# Patient Record
Sex: Male | Born: 1975 | Race: White | Hispanic: Yes | Marital: Single | Smoking: Current every day smoker
Health system: Southern US, Community
[De-identification: ages and names within clinical notes are randomized; demographics above are authoritative.]

---

## 2010-04-13 ENCOUNTER — Emergency Department (HOSPITAL_COMMUNITY)
Admission: EM | Admit: 2010-04-13 | Discharge: 2010-04-14 | Payer: Self-pay | Source: Home / Self Care | Admitting: Emergency Medicine

## 2010-04-13 LAB — URINALYSIS, ROUTINE W REFLEX MICROSCOPIC
Bilirubin Urine: NEGATIVE
Hgb urine dipstick: NEGATIVE
Protein, ur: NEGATIVE mg/dL
Specific Gravity, Urine: 1.028 (ref 1.005–1.030)
Urine Glucose, Fasting: NEGATIVE mg/dL
Urobilinogen, UA: 0.2 mg/dL (ref 0.0–1.0)

## 2010-04-13 LAB — BASIC METABOLIC PANEL
CO2: 24 mEq/L (ref 19–32)
Calcium: 9.3 mg/dL (ref 8.4–10.5)
Creatinine, Ser: 0.78 mg/dL (ref 0.4–1.5)
GFR calc Af Amer: >60 mL/min (ref >60)
Glucose, Bld: 212 mg/dL — ABNORMAL HIGH (ref 70–99)
Potassium: 3.6 mEq/L (ref 3.5–5.1)
Sodium: 134 mEq/L — ABNORMAL LOW (ref 135–145)

## 2010-04-13 LAB — POCT CARDIAC MARKERS
CKMB, poc: 1.5 ng/mL (ref 1.0–8.0)
Troponin i, poc: <0.05 ng/mL (ref 0.00–0.09)

## 2010-04-13 LAB — DIFFERENTIAL
Lymphocytes Relative: 37 % (ref 12–46)
Lymphs Abs: 3.3 10*3/uL (ref 0.7–4.0)
Monocytes Relative: 7 % (ref 3–12)
Neutrophils Relative %: 54 % (ref 43–77)

## 2010-04-13 LAB — CBC
HCT: 41.6 % (ref 39.0–52.0)
Hemoglobin: 14.2 g/dL (ref 13.0–17.0)
MCH: 29.1 pg (ref 26.0–34.0)
MCV: 85.2 fL (ref 78.0–100.0)
Platelets: 188 10*3/uL (ref 150–400)
RBC: 4.88 MIL/uL (ref 4.22–5.81)
WBC: 9 10*3/uL (ref 4.0–10.5)

## 2010-04-16 ENCOUNTER — Emergency Department (HOSPITAL_COMMUNITY)
Admission: EM | Admit: 2010-04-16 | Discharge: 2010-04-17 | Payer: Self-pay | Source: Home / Self Care | Admitting: Emergency Medicine

## 2010-04-16 LAB — BASIC METABOLIC PANEL
BUN: 9 mg/dL (ref 6–23)
CO2: 24 mEq/L (ref 19–32)
Chloride: 106 mEq/L (ref 96–112)
Glucose, Bld: 180 mg/dL — ABNORMAL HIGH (ref 70–99)
Potassium: 3.5 mEq/L (ref 3.5–5.1)
Sodium: 139 mEq/L (ref 135–145)

## 2010-04-16 LAB — CBC
HCT: 36.4 % — ABNORMAL LOW (ref 39.0–52.0)
Hemoglobin: 13.2 g/dL (ref 13.0–17.0)
MCV: 83.5 fL (ref 78.0–100.0)
RBC: 4.36 MIL/uL (ref 4.22–5.81)
RDW: 13.3 % (ref 11.5–15.5)
WBC: 11.5 10*3/uL — ABNORMAL HIGH (ref 4.0–10.5)

## 2010-04-16 LAB — URINALYSIS, ROUTINE W REFLEX MICROSCOPIC
Ketones, ur: NEGATIVE mg/dL
Specific Gravity, Urine: 1.02 (ref 1.005–1.030)
Urine Glucose, Fasting: NEGATIVE mg/dL
pH: 5.5 (ref 5.0–8.0)

## 2010-04-16 LAB — DIFFERENTIAL
Basophils Absolute: 0 10*3/uL (ref 0.0–0.1)
Eosinophils Relative: 1 % (ref 0–5)
Lymphocytes Relative: 23 % (ref 12–46)
Lymphs Abs: 2.6 10*3/uL (ref 0.7–4.0)
Neutro Abs: 7.9 10*3/uL — ABNORMAL HIGH (ref 1.7–7.7)

## 2010-04-16 LAB — HEPATIC FUNCTION PANEL
ALT: 174 U/L — ABNORMAL HIGH (ref 0–53)
AST: 75 U/L — ABNORMAL HIGH (ref 0–37)
Alkaline Phosphatase: 110 U/L (ref 39–117)
Bilirubin, Direct: 0.1 mg/dL (ref 0.0–0.3)
Indirect Bilirubin: 0.4 mg/dL (ref 0.3–0.9)
Total Bilirubin: 0.5 mg/dL (ref 0.3–1.2)

## 2013-12-11 ENCOUNTER — Emergency Department (HOSPITAL_COMMUNITY): Payer: Self-pay

## 2013-12-11 ENCOUNTER — Encounter (HOSPITAL_COMMUNITY): Payer: Self-pay | Admitting: Emergency Medicine

## 2013-12-11 ENCOUNTER — Emergency Department (HOSPITAL_COMMUNITY)
Admission: EM | Admit: 2013-12-11 | Discharge: 2013-12-11 | Disposition: A | Discharge status: Home / Self Care | Payer: Self-pay | Attending: Emergency Medicine | Admitting: Emergency Medicine

## 2013-12-11 DIAGNOSIS — M79641 Pain in right hand: Secondary | ICD-10-CM

## 2013-12-11 DIAGNOSIS — T148XXA Other injury of unspecified body region, initial encounter: Secondary | ICD-10-CM

## 2013-12-11 DIAGNOSIS — F17200 Nicotine dependence, unspecified, uncomplicated: Secondary | ICD-10-CM | POA: Insufficient documentation

## 2013-12-11 DIAGNOSIS — W230XXA Caught, crushed, jammed, or pinched between moving objects, initial encounter: Secondary | ICD-10-CM | POA: Insufficient documentation

## 2013-12-11 DIAGNOSIS — Y9389 Activity, other specified: Secondary | ICD-10-CM | POA: Insufficient documentation

## 2013-12-11 DIAGNOSIS — S6990XA Unspecified injury of unspecified wrist, hand and finger(s), initial encounter: Secondary | ICD-10-CM | POA: Insufficient documentation

## 2013-12-11 DIAGNOSIS — Y9289 Other specified places as the place of occurrence of the external cause: Secondary | ICD-10-CM | POA: Insufficient documentation

## 2013-12-11 DIAGNOSIS — S6000XA Contusion of unspecified finger without damage to nail, initial encounter: Secondary | ICD-10-CM | POA: Insufficient documentation

## 2013-12-11 DIAGNOSIS — S61209A Unspecified open wound of unspecified finger without damage to nail, initial encounter: Secondary | ICD-10-CM | POA: Insufficient documentation

## 2013-12-11 DIAGNOSIS — S6980XA Other specified injuries of unspecified wrist, hand and finger(s), initial encounter: Secondary | ICD-10-CM | POA: Insufficient documentation

## 2013-12-11 MED ORDER — IBUPROFEN 600 MG PO TABS: 600.0000 mg | ORAL_TABLET | Freq: Three times a day (TID) | ORAL | Status: DC | PRN

## 2013-12-11 MED ORDER — HYDROCODONE-ACETAMINOPHEN 5-325 MG PO TABS: 1.0000 | ORAL_TABLET | Freq: Four times a day (QID) | ORAL | Status: DC | PRN

## 2013-12-11 MED ORDER — OXYCODONE-ACETAMINOPHEN 5-325 MG PO TABS
1.0000 | ORAL_TABLET | Freq: Once | ORAL | Status: AC
  Administered 2013-12-11: 1 via ORAL
  Filled 2013-12-11: qty 1

## 2013-12-11 NOTE — ED Notes (Signed)
Patient states he was lifting barrell with several people yesterday and it fell on his R hand/finger.   Patient states no lac.  Patient states no other symptoms.

## 2013-12-11 NOTE — ED Provider Notes (Signed)
CSN: [id number]     Arrival date & time 12/11/13  0909 History   First MD Initiated Contact with Patient 12/11/13 [phone]     Chief Complaint  Patient presents with  . Finger Injury     (Consider location/radiation/quality/duration/timing/severity/associated sxs/prior Treatment) HPI Comments: Lucan Riner is a 38 y.o. male with no significant PMHx who presents today to the ED with complaints of R hand pain after having his hand crushed between a 400 gallon barrel and a truck yesterday morning. States the pain is 10/10 throbbing, constant, located over his R 3rd knuckle and 3rd-4th digits, radiating into his forearm, worse with movement of his hand, and improved with a friend's oral narcotics yesterday. Associated symptoms include some tingling in the 3rd digit tip, but denies weakness, loss of ROM, or numbness. Small wound over the PIP joint of R 3rd digit had minimal bleeding which was controlled with a bandaid. No change in color or swelling, no warmth. Denies other complaints. Pt states after he took the narcotic he was able to complete his work day using his hand, and denies any issues with his wrist.  Patient is a 38 y.o. male presenting with hand pain. The history is provided by the patient. No language interpreter was used.  Hand Pain This is a new problem. The current episode started yesterday. The problem occurs constantly. The problem has been unchanged. Associated symptoms include arthralgias (R hand and 3-4th digits) and joint swelling (R hand). Pertinent negatives include no fever, myalgias, numbness or weakness. The symptoms are aggravated by bending. He has tried oral narcotics for the symptoms. The treatment provided moderate relief.    History reviewed. No pertinent past medical history. History reviewed. No pertinent past surgical history. No family history on file. History  Substance Use Topics  . Smoking status: Current Every Day Smoker -- 1.00 packs/day    Types: Cigarettes    . Smokeless tobacco: Not on file  . Alcohol Use: No    Review of Systems  Constitutional: Negative for fever.  Musculoskeletal: Positive for arthralgias (R hand and 3-4th digits) and joint swelling (R hand). Negative for myalgias.  Skin: Positive for wound. Negative for color change.  Neurological: Negative for weakness and numbness.  Hematological: Does not bruise/bleed easily.   10 Systems reviewed and are negative for acute change except as noted in the HPI.     Allergies  Review of patient's allergies indicates no known allergies.  Home Medications   Prior to Admission medications   Medication Sig Start Date End Date Taking? Authorizing Provider  HYDROcodone-acetaminophen (NORCO) 5-325 MG per tablet Take 1-2 tablets by mouth every 6 (six) hours as needed for severe pain. 12/11/13   Jalecia Leon Strupp Camprubi-Soms, PA-C  ibuprofen (ADVIL,MOTRIN) 600 MG tablet Take 1 tablet (600 mg total) by mouth every 8 (eight) hours as needed for fever, headache, moderate pain or cramping. 12/11/13   Tayley Mudrick Strupp Camprubi-Soms, PA-C   BP 107/71  Pulse 75  Temp(Src) 97.1 F (36.2 C) (Oral)  Resp 18  SpO2 99% Physical Exam  Nursing note and vitals reviewed. Constitutional: He is oriented to person, place, and time. Vital signs are normal. He appears well-developed and well-nourished. He appears distressed (in pain).  WDWN VSS, appears to be in pain  HENT:  Head: Normocephalic and atraumatic.  Mouth/Throat: Mucous membranes are normal.  Eyes: Conjunctivae and EOM are normal. Right eye exhibits no discharge. Left eye exhibits no discharge.  Neck: Normal range of motion. Neck supple.  Cardiovascular:  Normal rate and intact distal pulses.   Intact distal pulses, cap refill brisk and present in all digits  Pulmonary/Chest: Effort normal. No respiratory distress.  Abdominal: Normal appearance. He exhibits no distension.  Musculoskeletal:       Right hand: He exhibits decreased range of  motion (due to pain), tenderness and laceration (small cut). He exhibits normal two-point discrimination, normal capillary refill, no deformity and no swelling. Normal sensation noted. Normal strength noted.       Hands: R 3rd and 4th digits with decreased ROM secondary to pain, TTP over MCP, PIP, and DIP joints with small cut to dorsal aspect of R 3rd PIP joint without active bleeding or signs of infection. Sensation grossly intact in all extremities, 2point discrimination intact, strength grossly WNL although somewhat limited with R hand grip due to pain.  Neurological: He is alert and oriented to person, place, and time. He has normal strength. No sensory deficit.  Skin: Skin is warm and dry. Laceration noted. No rash noted.  Small cut over R 3rd PIP as above  Psychiatric: He has a normal mood and affect.    ED Course  Procedures (including critical care time) Labs Review Labs Reviewed - No data to display  Imaging Review Dg Hand Complete Right  12/11/2013   CLINICAL DATA:  Injury right middle digit.  EXAM: RIGHT HAND - COMPLETE 3+ VIEW  COMPARISON:  None.  FINDINGS: There is no evidence of fracture or dislocation. There is no evidence of arthropathy or other focal bone abnormality. Soft tissues are unremarkable.  IMPRESSION: No acute abnormality.   Electronically Signed   By: Maisie Fus  Register   On: 12/11/2013 10:22     EKG Interpretation None      MDM   Final diagnoses:  Hand pain, right  Contusion    38y/o male with R hand injury. Will obtain xrays and eval for fx. Small cut not needing intervention at this time. Xray neg for fracture/dislocation. Will buddy tape fingers for comfort and give pain meds. Discussed RICE therapy. Will give resource guide and have him f/up with a PCP for ongoing management, do not believe hand referral is necessary at this time. I explained the diagnosis and have given explicit precautions to return to the ER including for any other new or worsening  symptoms. The patient understands and accepts the medical plan as it's been dictated and I have answered their questions. Discharge instructions concerning home care and prescriptions have been given. The patient is STABLE and is discharged to home in good condition.  BP 107/71  Pulse 75  Temp(Src) 97.1 F (36.2 C) (Oral)  Resp 18  SpO2 99%  Meds ordered this encounter  Medications  . oxyCODONE-acetaminophen (PERCOCET/ROXICET) 5-325 MG per tablet 1 tablet    Sig:   . HYDROcodone-acetaminophen (NORCO) 5-325 MG per tablet    Sig: Take 1-2 tablets by mouth every 6 (six) hours as needed for severe pain.    Dispense:  10 tablet    Refill:  0    Order Specific Question:  Supervising Provider    Answer:  Eber Hong D [[id number]]  . ibuprofen (ADVIL,MOTRIN) 600 MG tablet    Sig: Take 1 tablet (600 mg total) by mouth every 8 (eight) hours as needed for fever, headache, moderate pain or cramping.    Dispense:  30 tablet    Refill:  0    Order Specific Question:  Supervising Provider    Answer:  Eber Hong D [[id number]]  [place], [place] 12/11/13 1044

## 2013-12-11 NOTE — Discharge Instructions (Signed)
Use ice for 20 minutes at a time every hour to help with pain and swelling. Elevate your hand to help with pain and swelling. Buddy tape your fingers for support to help with pain. Use ibuprofen or norco as directed, as needed for pain but don't drive or operate heavy machinery while taking these medications. Use the resource guide below to find a regular doctor and check in with them in 1 week for ongoing symptoms. Return to the ER for changes or worsening symptoms.   Contusion A contusion is a deep bruise. Contusions are the result of an injury that caused bleeding under the skin. The contusion may turn blue, purple, or yellow. Minor injuries will give you a painless contusion, but more severe contusions may stay painful and swollen for a few weeks.  CAUSES  A contusion is usually caused by a blow, trauma, or direct force to an area of the body. SYMPTOMS   Swelling and redness of the injured area.  Bruising of the injured area.  Tenderness and soreness of the injured area.  Pain. DIAGNOSIS  The diagnosis can be made by taking a history and physical exam. An X-ray, CT scan, or MRI may be needed to determine if there were any associated injuries, such as fractures. TREATMENT  Specific treatment will depend on what area of the body was injured. In general, the best treatment for a contusion is resting, icing, elevating, and applying cold compresses to the injured area. Over-the-counter medicines may also be recommended for pain control. Ask your caregiver what the best treatment is for your contusion. HOME CARE INSTRUCTIONS   Put ice on the injured area.  Put ice in a plastic bag.  Place a towel between your skin and the bag.  Leave the ice on for 15-20 minutes, 3-4 times a day, or as directed by your health care provider.  Only take over-the-counter or prescription medicines for pain, discomfort, or fever as directed by your caregiver. Your caregiver may recommend avoiding  anti-inflammatory medicines (aspirin, ibuprofen, and naproxen) for 48 hours because these medicines may increase bruising.  Rest the injured area.  If possible, elevate the injured area to reduce swelling. SEEK IMMEDIATE MEDICAL CARE IF:   You have increased bruising or swelling.  You have pain that is getting worse.  Your swelling or pain is not relieved with medicines. MAKE SURE YOU:   Understand these instructions.  Will watch your condition.  Will get help right away if you are not doing well or get worse. Document Released: 12/14/2004 Document Revised: 03/11/2013 Document Reviewed: 01/09/2011 [facility] Patient Information 2015 [place], [place]. This information is not intended to replace advice given to you by your health care provider. Make sure you discuss any questions you have with your health care provider.  Cryotherapy Cryotherapy means treatment with cold. Ice or gel packs can be used to reduce both pain and swelling. Ice is the most helpful within the first 24 to 48 hours after an injury or flare-up from overusing a muscle or joint. Sprains, strains, spasms, burning pain, shooting pain, and aches can all be eased with ice. Ice can also be used when recovering from surgery. Ice is effective, has very few side effects, and is safe for most people to use. PRECAUTIONS  Ice is not a safe treatment option for people with:  Raynaud phenomenon. This is a condition affecting small blood vessels in the extremities. Exposure to cold may cause your problems to return.  Cold hypersensitivity. There are many  forms of cold hypersensitivity, including:  Cold urticaria. Red, itchy hives appear on the skin when the tissues begin to warm after being iced.  Cold erythema. This is a red, itchy rash caused by exposure to cold.  Cold hemoglobinuria. Red blood cells break down when the tissues begin to warm after being iced. The hemoglobin that carry oxygen are passed into the urine because  they cannot combine with blood proteins fast enough.  Numbness or altered sensitivity in the area being iced. If you have any of the following conditions, do not use ice until you have discussed cryotherapy with your caregiver:  Heart conditions, such as arrhythmia, angina, or chronic heart disease.  High blood pressure.  Healing wounds or open skin in the area being iced.  Current infections.  Rheumatoid arthritis.  Poor circulation.  Diabetes. Ice slows the blood flow in the region it is applied. This is beneficial when trying to stop inflamed tissues from spreading irritating chemicals to surrounding tissues. However, if you expose your skin to cold temperatures for too long or without the proper protection, you can damage your skin or nerves. Watch for signs of skin damage due to cold. HOME CARE INSTRUCTIONS Follow these tips to use ice and cold packs safely.  Place a dry or damp towel between the ice and skin. A damp towel will cool the skin more quickly, so you may need to shorten the time that the ice is used.  For a more rapid response, add gentle compression to the ice.  Ice for no more than 10 to 20 minutes at a time. The bonier the area you are icing, the less time it will take to get the benefits of ice.  Check your skin after 5 minutes to make sure there are no signs of a poor response to cold or skin damage.  Rest 20 minutes or more between uses.  Once your skin is numb, you can end your treatment. You can test numbness by very lightly touching your skin. The touch should be so light that you do not see the skin dimple from the pressure of your fingertip. When using ice, most people will feel these normal sensations in this order: cold, burning, aching, and numbness.  Do not use ice on someone who cannot communicate their responses to pain, such as small children or people with dementia. HOW TO MAKE AN ICE PACK Ice packs are the most common way to use ice therapy.  Other methods include ice massage, ice baths, and cryosprays. Muscle creams that cause a cold, tingly feeling do not offer the same benefits that ice offers and should not be used as a substitute unless recommended by your caregiver. To make an ice pack, do one of the following:  Place crushed ice or a bag of frozen vegetables in a sealable plastic bag. Squeeze out the excess air. Place this bag inside another plastic bag. Slide the bag into a pillowcase or place a damp towel between your skin and the bag.  Mix 3 parts water with 1 part rubbing alcohol. Freeze the mixture in a sealable plastic bag. When you remove the mixture from the freezer, it will be slushy. Squeeze out the excess air. Place this bag inside another plastic bag. Slide the bag into a pillowcase or place a damp towel between your skin and the bag. SEEK MEDICAL CARE IF:  You develop white spots on your skin. This may give the skin a blotchy (mottled) appearance.  Your skin turns  blue or pale.  Your skin becomes waxy or hard.  Your swelling gets worse. MAKE SURE YOU:   Understand these instructions.  Will watch your condition.  Will get help right away if you are not doing well or get worse. Document Released: 10/31/2010 Document Revised: 07/21/2013 Document Reviewed: 10/31/2010 [facility] Patient Information 2015 [place], [place]. This information is not intended to replace advice given to you by your health care provider. Make sure you discuss any questions you have with your health care provider.  Emergency Department Resource Guide 1) Find a Doctor and Pay Out of Pocket Although you won't have to find out who is covered by your insurance plan, it is a good idea to ask around and get recommendations. You will then need to call the office and see if the doctor you have chosen will accept you as a new patient and what types of options they offer for patients who are self-pay. Some doctors offer discounts or will set up  payment plans for their patients who do not have insurance, but you will need to ask so you aren't surprised when you get to your appointment.  2) Contact Your Local Health Department Not all health departments have doctors that can see patients for sick visits, but many do, so it is worth a call to see if yours does. If you don't know where your local health department is, you can check in your phone book. The CDC also has a tool to help you locate your state's health department, and many state websites also have listings of all of their local health departments.  3) Find a Walk-in Clinic If your illness is not likely to be very severe or complicated, you may want to try a walk in clinic. These are popping up all over the country in pharmacies, drugstores, and shopping centers. They're usually staffed by nurse practitioners or physician assistants that have been trained to treat common illnesses and complaints. They're usually fairly quick and inexpensive. However, if you have serious medical issues or chronic medical problems, these are probably not your best option.  No Primary Care Doctor: - Call Health Connect at  [phone] - they can help you locate a primary care doctor that  accepts your insurance, provides certain services, etc. - Physician Referral Service- [phone]  Chronic Pain Problems: Organization         Address  Phone   Notes  [facility]  [phone] Patients need to be referred by their primary care doctor.   Medication Assistance: Organization         Address  Phone   Notes  [facility] Medication [facility] [address] [place], [place] [address] [phone] --Must be a resident of [facility] -- Must have NO insurance coverage whatsoever (no Medicaid/ Medicare, etc.) -- The pt. MUST have a primary care doctor that directs their care regularly and follows them in the community   [facility]  [phone]   [employer]  [phone]    Agencies that provide inexpensive medical care: Organization         Address  Phone   Notes  [facility]  [phone]   [facility]    [phone]   [facility] [address] [place] [address] [place], [place] [address] [phone]   [facility] [address] [place], [place] [phone]   [facility]    [phone])  [phone]   [facility]    [phone]   [facility]  [address], [place] Phone:  [phone], Fax:  [phone] Hours of Operation:  9 am - 6 pm, M-F.  Also accepts Medicaid/Medicare and self-pay.  [facility]  [address], [place] Phone: [phone], Fax: [phone]. Hours of Operation:  8:30 am - 5:30 pm, M-F.  Also accepts Medicaid and self-pay.  [facility] [place] [address], [place] Phone: [phone]   [facility] [address] Natasha Bence [place], [place] [phone], Ext. [phone] Mondays & Thursdays: 7-9 AM.  First 15 patients are seen on a first come, first serve basis.    Medicaid-accepting [facility] Providers:  Organization         Address  Phone   Notes  [facility] [address], [place] [phone] Also accepts self-pay patients.  [facility] [address] [place], [place]  [phone]   [facility] [address], [place] [phone]   [facility] [address] [place] [phone]   Renaye Rakers [address], [place]   [phone] Only accepts [place] patients after they have their name applied to their card.   Self-Pay (no insurance) in [facility]:  Organization         Address  Phone   Notes  Sickle Cell Patients, [facility] [address] [place], [place]  [phone]   [facility] [address] [place], [place] [phone]   [facility] [place]  [address], [place] [phone]   [facility]/Dr. Osei-Bonsu  [address], [place] or [address], [place] [phone] Phone number for both [place] and [place] locations is the same.  [facility] [address], [place] [phone]   [facility] [address], [place] or [address] [phone]   [facility] [address], [place] [phone], phone; [phone], fax Sees patients 1st and 3rd Saturday of every month.  Must not qualify for public or private insurance (i.e. Medicaid, Medicare, [place] Health Choice, Veterans' Benefits)  Household income should be no more than 200% of the poverty level The clinic cannot treat you if you are pregnant or think you are pregnant  Sexually transmitted diseases are not treated at the clinic.

## 2013-12-11 NOTE — ED Provider Notes (Signed)
Medical screening examination/treatment/procedure(s) were performed by non-physician practitioner and as supervising physician I was immediately available for consultation/collaboration.    Linwood Dibbles, MD 12/11/13 [phone]

## 2015-02-03 ENCOUNTER — Encounter (HOSPITAL_COMMUNITY): Payer: Self-pay | Admitting: *Deleted

## 2015-02-03 ENCOUNTER — Emergency Department (HOSPITAL_COMMUNITY)
Admission: EM | Admit: 2015-02-03 | Discharge: 2015-02-03 | Disposition: A | Discharge status: Home / Self Care | Payer: Self-pay | Attending: Emergency Medicine | Admitting: Emergency Medicine

## 2015-02-03 DIAGNOSIS — K0889 Other specified disorders of teeth and supporting structures: Secondary | ICD-10-CM | POA: Insufficient documentation

## 2015-02-03 DIAGNOSIS — F17210 Nicotine dependence, cigarettes, uncomplicated: Secondary | ICD-10-CM | POA: Insufficient documentation

## 2015-02-03 MED ORDER — PENICILLIN V POTASSIUM 500 MG PO TABS: 500.0000 mg | ORAL_TABLET | Freq: Two times a day (BID) | ORAL | Status: AC

## 2015-02-03 MED ORDER — OXYCODONE-ACETAMINOPHEN 5-325 MG PO TABS
1.0000 | ORAL_TABLET | Freq: Once | ORAL | Status: AC
  Administered 2015-02-03: 1 via ORAL

## 2015-02-03 MED ORDER — IBUPROFEN 800 MG PO TABS: 800.0000 mg | ORAL_TABLET | Freq: Three times a day (TID) | ORAL | Status: AC

## 2015-02-03 MED ORDER — OXYCODONE-ACETAMINOPHEN 5-325 MG PO TABS
ORAL_TABLET | ORAL | Status: DC
Start: 2015-02-03 — End: 2015-02-03
  Filled 2015-02-03: qty 1

## 2015-02-03 NOTE — ED Provider Notes (Signed)
CSN: [id number]     Arrival date & time 02/03/15  1723 History  By signing my name below, I, Alvin Ellis, attest that this documentation has been prepared under the direction and in the presence of Melburn Hake, [place].  Electronically Signed: Gwenyth Ellis, ED Scribe. 02/03/2015. 8:25 PM. Chief Complaint  Patient presents with  . Dental Pain   The history is provided by the patient. No language interpreter was used.    HPI Comments: Alvin Ellis is a 39 y.o. male who presents to the Emergency Department complaining of constant, moderate left-sided upper and lower dental pain that started a few days ago. Pt states left-sided HA, facial swelling and fever as associated symptoms. His pain becomes worse with exposure to cold. He has tried Tylenol with no relief. Pt was seen by a dentist 2 weeks ago and was prescribed antibiotics for a right dental infection, but has been unable to follow-up because of financial difficulties. He denies trouble swallowing.  History reviewed. No pertinent past medical history. History reviewed. No pertinent past surgical history. History reviewed. No pertinent family history. Social History  Substance Use Topics  . Smoking status: Current Every Day Smoker -- 1.00 packs/day    Types: Cigarettes  . Smokeless tobacco: None  . Alcohol Use: No   Review of Systems  Constitutional: Positive for fever.  HENT: Positive for dental problem and facial swelling. Negative for trouble swallowing.   Neurological: Positive for headaches.   Allergies  Review of patient's allergies indicates no known allergies.  Home Medications   Prior to Admission medications   Medication Sig Start Date End Date Taking? Authorizing Provider  HYDROcodone-acetaminophen (NORCO) 5-325 MG per tablet Take 1-2 tablets by mouth every 6 (six) hours as needed for severe pain. 12/11/13   Mercedes Camprubi-Soms, PA-C  ibuprofen (ADVIL,MOTRIN) 800 MG tablet Take 1 tablet (800 mg total) by mouth  3 (three) times daily. 02/03/15   Barrett Henle, PA-C  penicillin v potassium (VEETID) 500 MG tablet Take 1 tablet (500 mg total) by mouth 2 (two) times daily. 02/03/15 02/10/15  Satira Sark Shasha Buchbinder, PA-C   BP 116/75 mmHg  Pulse 75  Temp(Src) 97.9 F (36.6 C) (Oral)  Resp 18  SpO2 98% Physical Exam  Constitutional: He is oriented to person, place, and time. He appears well-developed and well-nourished. No distress.  HENT:  Head: Normocephalic and atraumatic.  Mouth/Throat: Uvula is midline, oropharynx is clear and moist and mucous membranes are normal. No oral lesions. No trismus in the jaw. Normal dentition. No dental abscesses, uvula swelling or dental caries. No oropharyngeal exudate, posterior oropharyngeal edema, posterior oropharyngeal erythema or tonsillar abscesses.    Eyes: Conjunctivae and EOM are normal. Right eye exhibits no discharge. Left eye exhibits no discharge. No scleral icterus.  Neck: Normal range of motion. Neck supple. No tracheal deviation present.  No facial or neck swelling.  Cardiovascular: Normal rate.   Pulmonary/Chest: Effort normal. No stridor. No respiratory distress.  Lymphadenopathy:    He has no cervical adenopathy.  Neurological: He is alert and oriented to person, place, and time.  Skin: Skin is warm and dry.  Psychiatric: He has a normal mood and affect. His behavior is normal.  Nursing note and vitals reviewed.   ED Course  Procedures  DIAGNOSTIC STUDIES: Oxygen Saturation is 99% on RA, normal by my interpretation.    COORDINATION OF CARE: 8:25 PM Discussed treatment plan with pt which includes Ibuprofen-800 mg, ice and antibiotics. Advised pt to return with increased  swelling or redness, fever and trouble swallowing. Pt agreed to plan.  Labs Review Labs Reviewed - No data to display  Imaging Review No results found.  Filed Vitals:   02/03/15 2038  BP: 116/75  Pulse: 75  Temp:   Resp: 18     MDM  Pt presents with  left-sided dental pain. Was treated by dentist 2 weeks ago with antibiotics for right-sided dental infection, now complaining of left-sided dental pain. He reports he was supposed to follow-up with the dentist after finishing his antibiotics but notes he was unable to due to financial problems. VSS. Exam revealed mild swelling and erythema to left lower molar gingiva. No signs of dental or peritonsillar abscess. Airway patient. No facial or neck swelling. NAD. Plan to discharge patient home with NSAIDs and penicillin. Advised patient to follow up with his dentist. Advised pt to return with increased swelling or redness, fever and trouble swallowing.  Evaluation does not show pathology requring ongoing emergent intervention or admission. Pt is hemodynamically stable and mentating appropriately. Discussed findings/results and plan with patient/guardian, who agrees with plan. All questions answered. Return precautions discussed and outpatient follow up given.    Final diagnoses:  Pain, dental    I personally performed the services described in this documentation, which was scribed in my presence. The recorded information has been reviewed and is accurate.    Satira Sarkicole Elizabeth FairfieldNadeau, [place]-C 02/03/15 2332  Lorre NickAnthony Allen, MD 02/05/15 [phone]

## 2015-02-03 NOTE — Discharge Instructions (Signed)
Take your medications as prescribed. You may also apply ice to the affected area for 10-15 minutes 3-4 times daily as needed for pain relief. Follow-up with your dentist. Return to the emergency department if symptoms worsen or new onset of fever, difficulty swallowing, difficulty breathing, facial/neck swelling.  [facility] [employer] [facility] [facility] [address] [place], [place] [address] Phone [phone]  [facility] [employer] [facility] [place] exemplifies the [employer] vision to improve the health and quality of life of all [place] by Public house managercreating leaders with a passion to care for the underserved and by leading the nation in community-based, service learning oral health education.  We are committed to offering comprehensive general dental services for adults, children and special needs patients in a safe, caring and professional setting.   Appointments: Our clinic is open Monday through Friday 8:00 a.m. until 5:00 p.m. The amount of time scheduled for an appointment depends on the patients specific needs. We ask that you keep your appointed time for care or provide 24-hour notice of all appointment changes. Parents or legal guardians must accompany minor children.   Payment for Services: Medicaid and other insurance plans are welcome. Payment for services is due when services are rendered and may be made by cash or credit card. If you have dental insurance, we will assist you with your claim submission.    Emergencies:  Emergency services will be provided Monday through Friday on a walk-in basis.  Please arrive early for emergency services. After hours emergency services will be provided for patients of record as required.   Services:  Armed forces operational officerComprehensive General Dentistry Childrens Dentistry Oral Surgery - Extractions Root  Canals Sealants and Tooth Colored Fillings Crowns and Bridges Dentures and Partial Dentures Implant Services Periodontal Services and Cleanings Cosmetic Armed forces operational officerTooth Whitening Digital Radiography 3-D/[facility]

## 2015-02-03 NOTE — ED Notes (Signed)
Pt reports dental pain for weeks, has been to dentist and given antibiotics for infection. Now having severe pain to entire left side of head, face and neck. Airway intact. Pt tearful at triage.

## 2016-05-25 IMAGING — CR DG HAND COMPLETE 3+V*R*
3 series · 3 of 3 positions shown · non-contrast
Comparison: None.

CLINICAL DATA: Injury right middle digit.

EXAM:
RIGHT HAND - COMPLETE 3+ VIEW

[x hand pa right]
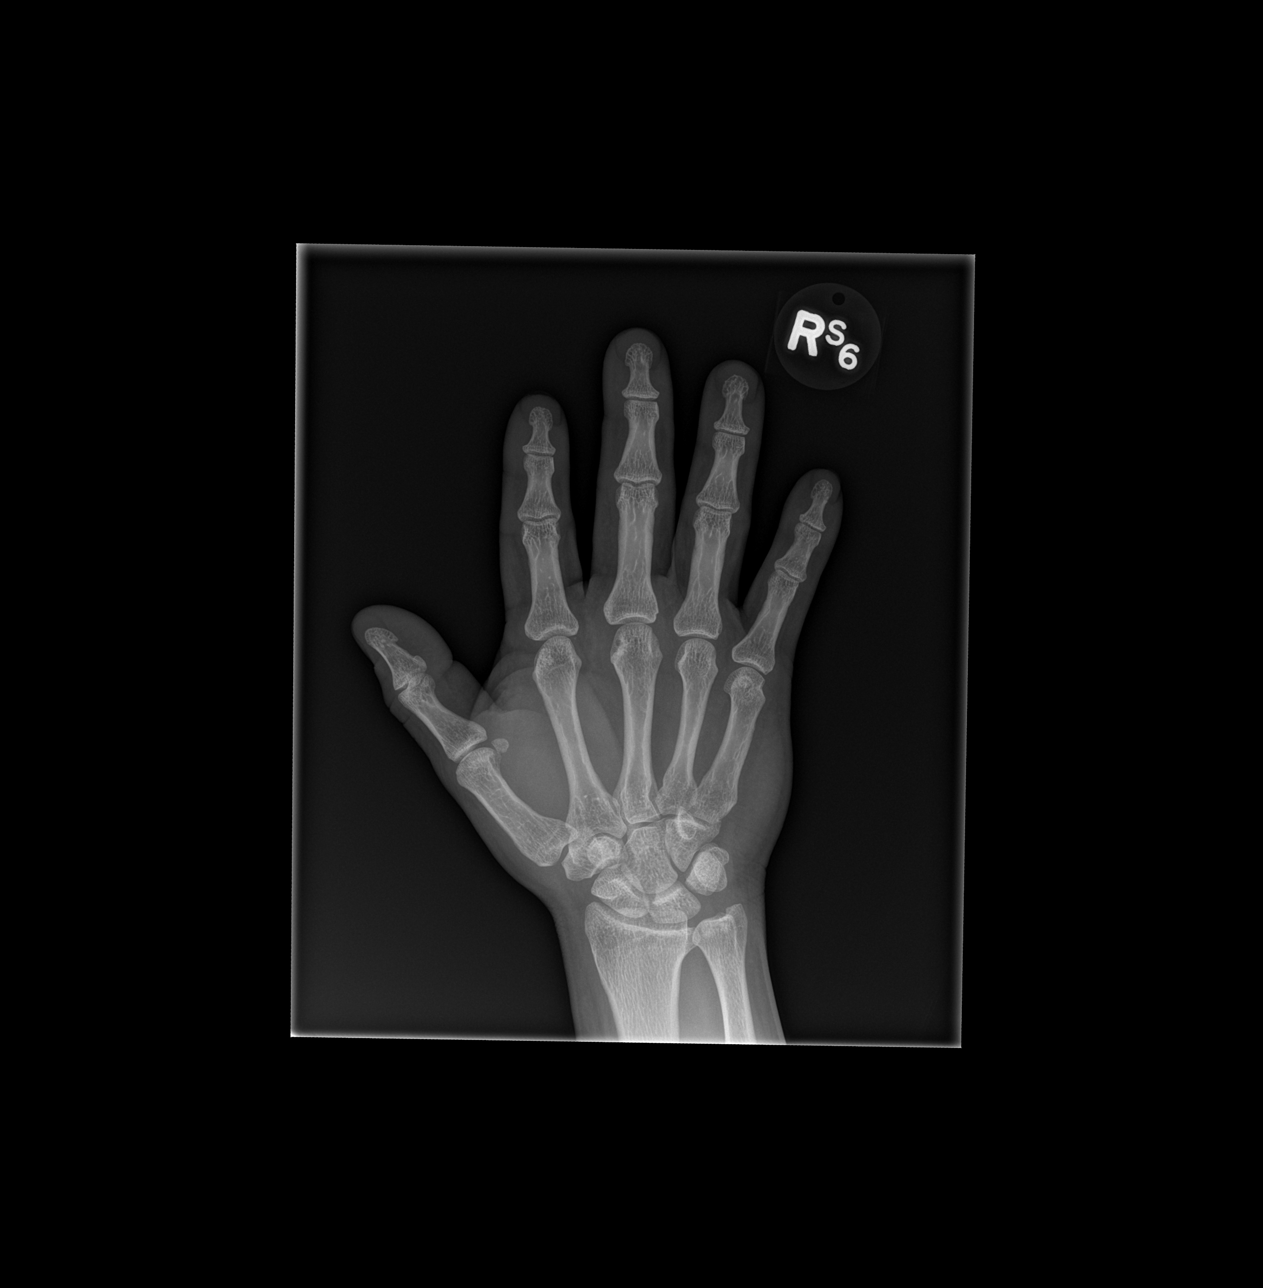

[x hand obl right]
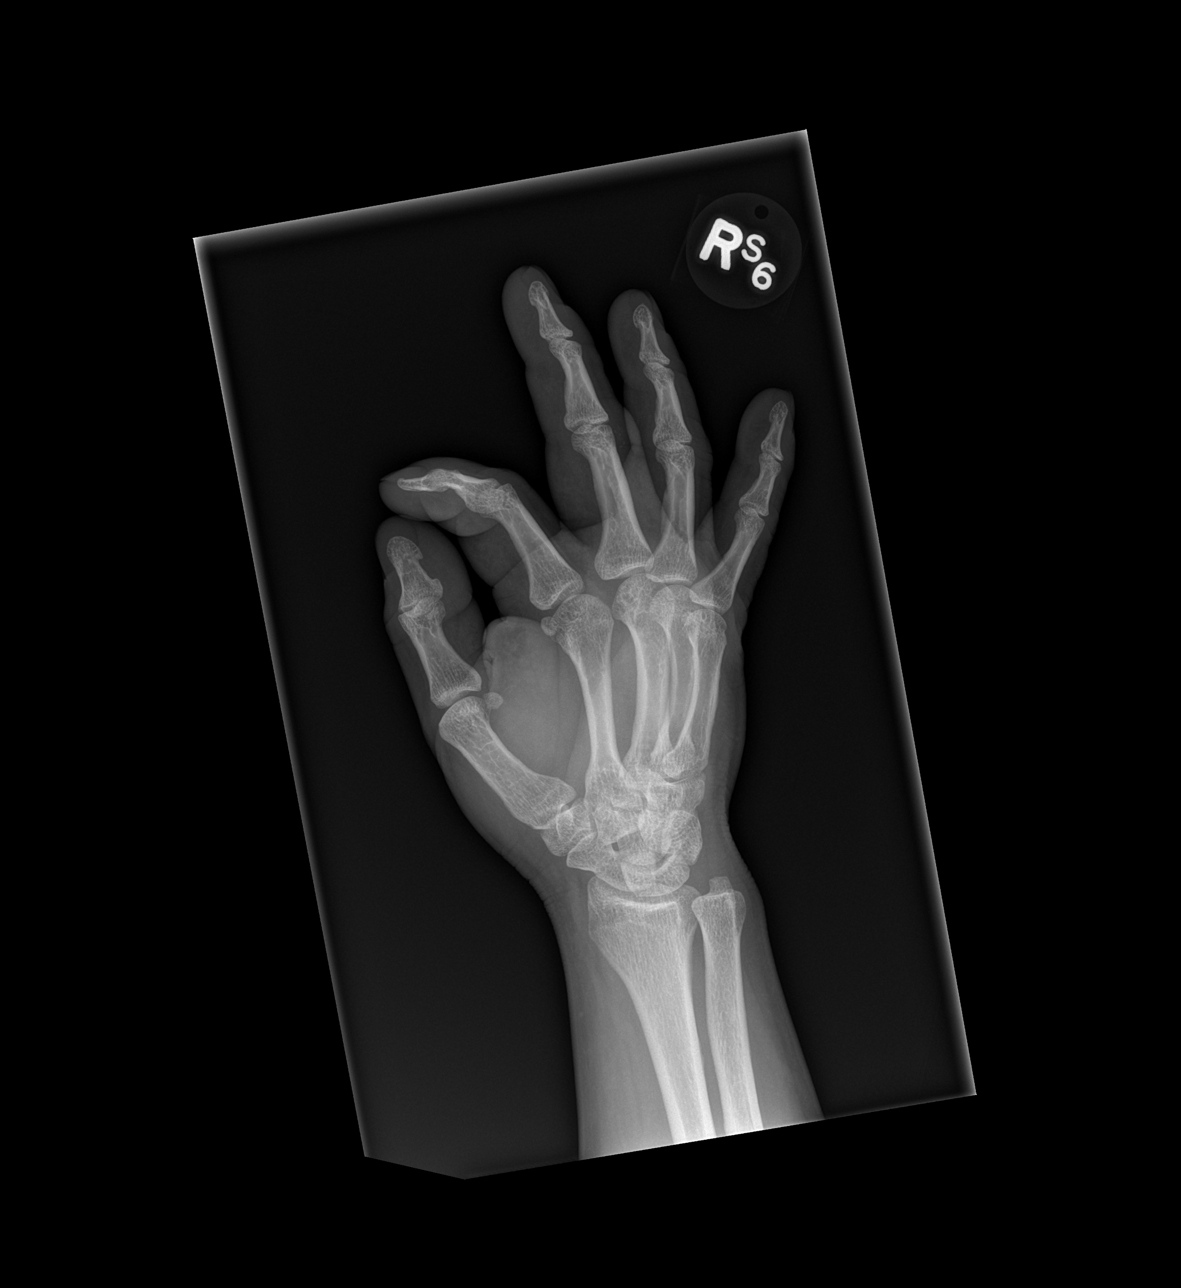

[x hand lat right]
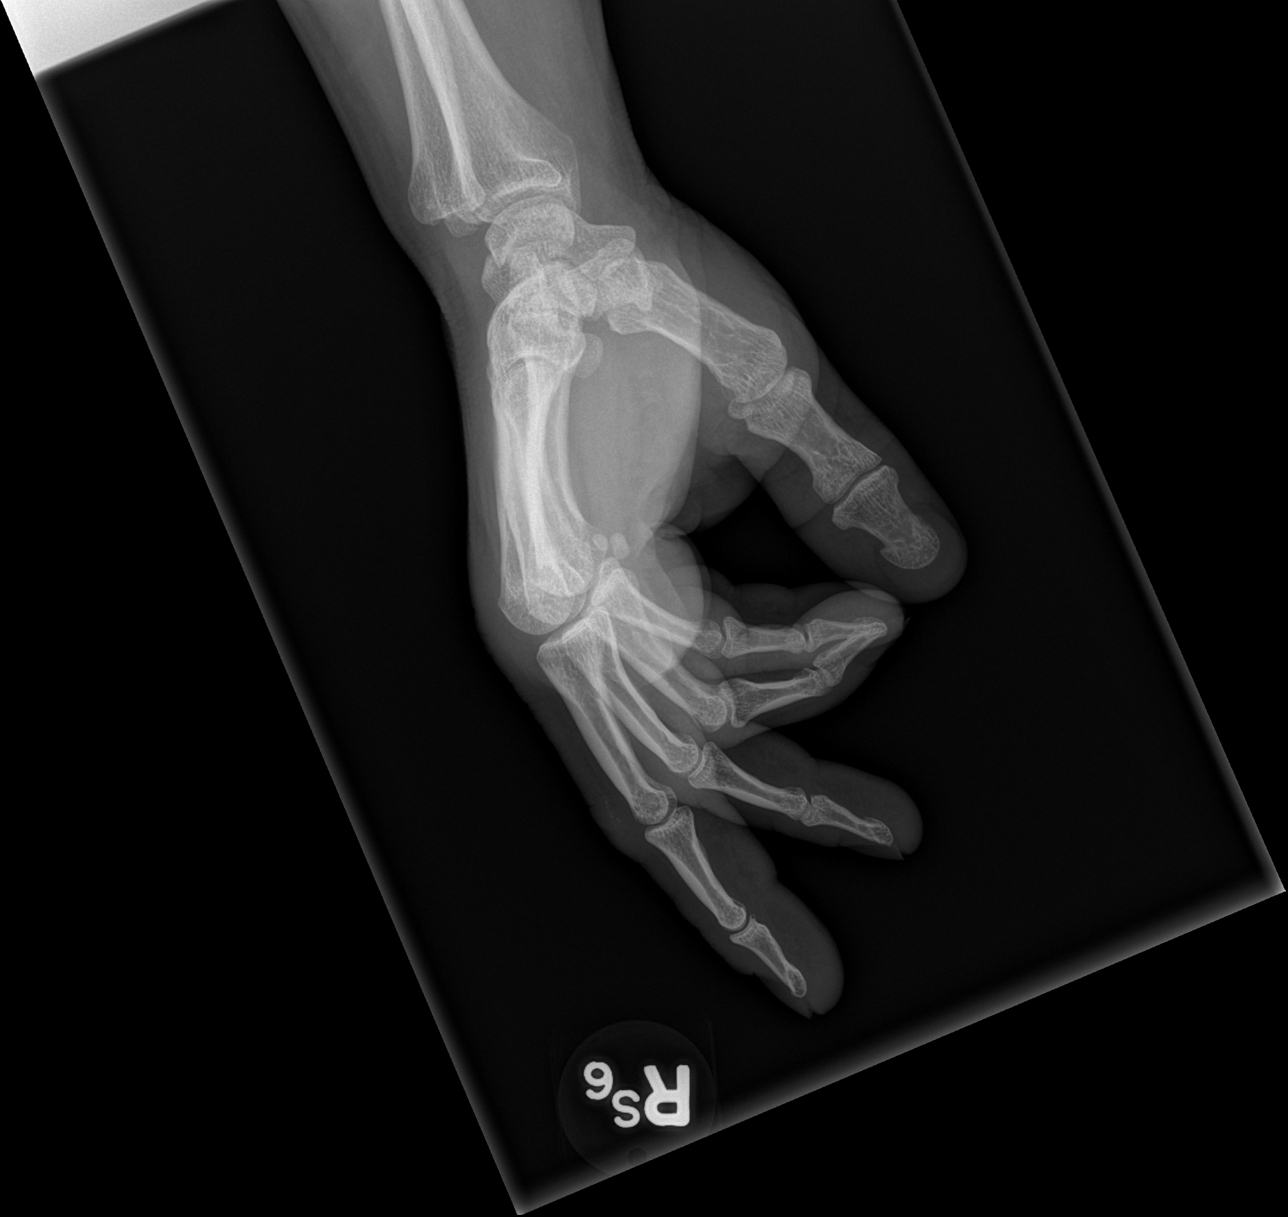

[3 of 3 positions shown; findings below may reference images not displayed]

FINDINGS: There is no evidence of fracture or dislocation. There is no
evidence of arthropathy or other focal bone abnormality. Soft
tissues are unremarkable.
IMPRESSION: No acute abnormality.

## 2018-07-06 ENCOUNTER — Emergency Department (HOSPITAL_BASED_OUTPATIENT_CLINIC_OR_DEPARTMENT_OTHER)
Admission: EM | Admit: 2018-07-06 | Discharge: 2018-07-06 | Disposition: A | Discharge status: Home / Self Care | Payer: Self-pay | Attending: Emergency Medicine | Admitting: Emergency Medicine

## 2018-07-06 ENCOUNTER — Encounter (HOSPITAL_BASED_OUTPATIENT_CLINIC_OR_DEPARTMENT_OTHER): Payer: Self-pay | Admitting: *Deleted

## 2018-07-06 ENCOUNTER — Other Ambulatory Visit: Payer: Self-pay

## 2018-07-06 DIAGNOSIS — Z79899 Other long term (current) drug therapy: Secondary | ICD-10-CM | POA: Insufficient documentation

## 2018-07-06 DIAGNOSIS — K047 Periapical abscess without sinus: Secondary | ICD-10-CM | POA: Insufficient documentation

## 2018-07-06 DIAGNOSIS — K0889 Other specified disorders of teeth and supporting structures: Secondary | ICD-10-CM | POA: Insufficient documentation

## 2018-07-06 DIAGNOSIS — F17210 Nicotine dependence, cigarettes, uncomplicated: Secondary | ICD-10-CM | POA: Insufficient documentation

## 2018-07-06 MED ORDER — OXYCODONE-ACETAMINOPHEN 5-325 MG PO TABS
2.0000 | ORAL_TABLET | Freq: Once | ORAL | Status: AC
  Administered 2018-07-06: 20:00:00 2 via ORAL
  Filled 2018-07-06: qty 2

## 2018-07-06 MED ORDER — CLINDAMYCIN HCL 300 MG PO CAPS: 300.0000 mg | ORAL_CAPSULE | Freq: Four times a day (QID) | ORAL | 0 refills | Status: AC

## 2018-07-06 MED ORDER — HYDROCODONE-ACETAMINOPHEN 5-325 MG PO TABS: 1.0000 | ORAL_TABLET | Freq: Four times a day (QID) | ORAL | 0 refills | Status: AC | PRN

## 2018-07-06 MED ORDER — CLINDAMYCIN HCL 150 MG PO CAPS
300.0000 mg | ORAL_CAPSULE | Freq: Once | ORAL | Status: AC
  Administered 2018-07-06: 300 mg via ORAL
  Filled 2018-07-06: qty 2

## 2018-07-06 NOTE — ED Triage Notes (Signed)
Pt reports issues with teeth for months, worse over the last week. Now very painful and he has a blister on his gum left upper side

## 2018-07-06 NOTE — Discharge Instructions (Signed)
Take motrin for pain   Take clindamycin four times daily for a week   See your dentist   Return to ER if you have worse swelling and pain, trouble swallowing, fever

## 2018-07-06 NOTE — ED Notes (Signed)
No signature pad on computer. Pt verbalized understanding and all questions answered. Discharge paperwork reviewed.

## 2018-07-06 NOTE — ED Provider Notes (Signed)
[facility] EMERGENCY DEPARTMENT Provider Note   CSN: [id number] Arrival date & time: 07/06/18  1933    History   Chief Complaint Chief Complaint  Patient presents with  . Dental Pain    HPI Alvin Ellis is a 43 y.o. male here presenting with dental pain.  Patient states that he has been having dental issues over the last several months.  Patient had previous dental infection before.  Patient does not have a dentist currently.  Patient states that over the last week he has progressive pain in the left upper tooth now has some swelling in the left gum.  Patient denies any trouble swallowing or fevers.  Patient states that he can get into see a dentist later this week.    The history is provided by the patient.    History reviewed. No pertinent past medical history.  There are no active problems to display for this patient.   History reviewed. No pertinent surgical history.      Home Medications    Prior to Admission medications   Medication Sig Start Date End Date Taking? Authorizing Provider  HYDROcodone-acetaminophen (NORCO) 5-325 MG per tablet Take 1-2 tablets by mouth every 6 (six) hours as needed for severe pain. 12/11/13   Street, Mercedes, PA-C  ibuprofen (ADVIL,MOTRIN) 800 MG tablet Take 1 tablet (800 mg total) by mouth 3 (three) times daily. 02/03/15   Alvin Henle, PA-C    Family History No family history on file.  Social History Social History   Tobacco Use  . Smoking status: Current Every Day Smoker    Packs/day: 1.00    Types: Cigarettes  . Smokeless tobacco: Never Used  Substance Use Topics  . Alcohol use: Yes  . Drug use: No     Allergies   Patient has no known allergies.   Review of Systems Review of Systems  HENT: Positive for dental problem.   All other systems reviewed and are negative.    Physical Exam Updated Vital Signs BP (!) 161/99 (BP Location: Right Arm)   Pulse 83   Temp 98.1 F (36.7 C) (Oral)    Resp 18   Ht 5' (1.524 m)   Wt 59 kg   SpO2 100%   BMI 25.39 kg/m   Physical Exam Vitals signs and nursing note reviewed.  Constitutional:      Comments: Uncomfortable   HENT:     Head: Normocephalic.     Right Ear: Tympanic membrane normal.     Left Ear: Tympanic membrane normal.     Nose: Nose normal.     Mouth/Throat:     Comments: L buccal mucosa swollen but no obvious fluctuance. There are some cavities but no obvious periapical abscess. No PTA, floor of mouth soft and nontender  Eyes:     Extraocular Movements: Extraocular movements intact.     Pupils: Pupils are equal, round, and reactive to light.  Neck:     Musculoskeletal: Normal range of motion.     Comments: Mild L cervical LAD  Cardiovascular:     Pulses: Normal pulses.  Pulmonary:     Effort: Pulmonary effort is normal.  Abdominal:     General: Abdomen is flat.  Musculoskeletal: Normal range of motion.  Skin:    General: Skin is warm.     Capillary Refill: Capillary refill takes less than 2 seconds.  Neurological:     General: No focal deficit present.     Mental Status: He is alert.  Psychiatric:        Mood and Affect: Mood normal.        Behavior: Behavior normal.      ED Treatments / Results  Labs (all labs ordered are listed, but only abnormal results are displayed) Labs Reviewed - No data to display  EKG None  Radiology No results found.  Procedures Procedures (including critical care time)  Medications Ordered in ED Medications  clindamycin (CLEOCIN) capsule 300 mg (has no administration in time range)  oxyCODONE-acetaminophen (PERCOCET/ROXICET) 5-325 MG per tablet 2 tablet (has no administration in time range)     Initial Impression / Assessment and Plan / ED Course  I have reviewed the triage vital signs and the nursing notes.  Pertinent labs & imaging results that were available during my care of the patient were reviewed by me and considered in my medical decision making  (see chart for details).       Alvin Ellis is a 43 y.o. male here with dental pain. Has swelling L buccal mucosa. No obvious periapical abscess, no signs of ludwig. Likely dental infection. Will dc home with clindamycin, pain medicine. Gave strict return precautions and will have him follow up with dentist    Final Clinical Impressions(s) / ED Diagnoses   Final diagnoses:  Pain, dental  Dental infection    ED Discharge Orders    None       Alvin Ellis, Alvin Dillow Hsienta, MD 07/06/18 2005
